# Patient Record
Sex: Male | Born: 1935 | Race: White | Hispanic: No | Smoking: Never smoker
Health system: Southern US, Community
[De-identification: ages and names within clinical notes are randomized; demographics above are authoritative.]

---

## 2021-08-18 ENCOUNTER — Encounter (HOSPITAL_COMMUNITY): Payer: Self-pay

## 2021-08-18 ENCOUNTER — Emergency Department (HOSPITAL_COMMUNITY): Payer: Medicare Other

## 2021-08-18 ENCOUNTER — Other Ambulatory Visit: Payer: Self-pay

## 2021-08-18 ENCOUNTER — Emergency Department (HOSPITAL_COMMUNITY)
Admission: EM | Admit: 2021-08-18 | Discharge: 2021-08-18 | Disposition: A | Discharge status: Home / Self Care | Payer: Medicare Other | Attending: Emergency Medicine | Admitting: Emergency Medicine

## 2021-08-18 DIAGNOSIS — R638 Other symptoms and signs concerning food and fluid intake: Secondary | ICD-10-CM | POA: Diagnosis not present

## 2021-08-18 DIAGNOSIS — E870 Hyperosmolality and hypernatremia: Secondary | ICD-10-CM | POA: Diagnosis not present

## 2021-08-18 DIAGNOSIS — Z79899 Other long term (current) drug therapy: Secondary | ICD-10-CM | POA: Insufficient documentation

## 2021-08-18 DIAGNOSIS — R4781 Slurred speech: Secondary | ICD-10-CM | POA: Diagnosis not present

## 2021-08-18 DIAGNOSIS — R29818 Other symptoms and signs involving the nervous system: Secondary | ICD-10-CM

## 2021-08-18 DIAGNOSIS — R531 Weakness: Secondary | ICD-10-CM | POA: Diagnosis present

## 2021-08-18 DIAGNOSIS — I639 Cerebral infarction, unspecified: Secondary | ICD-10-CM | POA: Diagnosis not present

## 2021-08-18 DIAGNOSIS — I951 Orthostatic hypotension: Secondary | ICD-10-CM | POA: Insufficient documentation

## 2021-08-18 LAB — CBC
HCT: 31.1 % — ABNORMAL LOW (ref 39.0–52.0)
Hemoglobin: 10 g/dL — ABNORMAL LOW (ref 13.0–17.0)
MCH: 29 pg (ref 26.0–34.0)
MCHC: 32.2 g/dL (ref 30.0–36.0)
MCV: 90.1 fL (ref 80.0–100.0)
Platelets: 160 10*3/uL (ref 150–400)
RBC: 3.45 MIL/uL — ABNORMAL LOW (ref 4.22–5.81)
RDW: 13.9 % (ref 11.5–15.5)
WBC: 5.7 10*3/uL (ref 4.0–10.5)
nRBC: 0 % (ref 0.0–0.2)

## 2021-08-18 LAB — I-STAT CHEM 8, ED
BUN: 30 mg/dL — ABNORMAL HIGH (ref 8–23)
Calcium, Ion: 1.15 mmol/L (ref 1.15–1.40)
Chloride: 112 mmol/L — ABNORMAL HIGH (ref 98–111)
Creatinine, Ser: 1.3 mg/dL — ABNORMAL HIGH (ref 0.61–1.24)
Glucose, Bld: 105 mg/dL — ABNORMAL HIGH (ref 70–99)
HCT: 27 % — ABNORMAL LOW (ref 39.0–52.0)
Hemoglobin: 9.2 g/dL — ABNORMAL LOW (ref 13.0–17.0)
Potassium: 4.5 mmol/L (ref 3.5–5.1)
Sodium: 142 mmol/L (ref 135–145)
TCO2: 21 mmol/L — ABNORMAL LOW (ref 22–32)

## 2021-08-18 LAB — COMPREHENSIVE METABOLIC PANEL
ALT: 17 U/L (ref 0–44)
AST: 24 U/L (ref 15–41)
Albumin: 3.4 g/dL — ABNORMAL LOW (ref 3.5–5.0)
Alkaline Phosphatase: 96 U/L (ref 38–126)
Anion gap: 5 (ref 5–15)
BUN: 31 mg/dL — ABNORMAL HIGH (ref 8–23)
CO2: 21 mmol/L — ABNORMAL LOW (ref 22–32)
Calcium: 9.2 mg/dL (ref 8.9–10.3)
Chloride: 115 mmol/L — ABNORMAL HIGH (ref 98–111)
Creatinine, Ser: 1.34 mg/dL — ABNORMAL HIGH (ref 0.61–1.24)
GFR, Estimated: 50 mL/min — ABNORMAL LOW (ref >60)
Glucose, Bld: 106 mg/dL — ABNORMAL HIGH (ref 70–99)
Potassium: 4.6 mmol/L (ref 3.5–5.1)
Sodium: 141 mmol/L (ref 135–145)
Total Bilirubin: 1 mg/dL (ref 0.3–1.2)
Total Protein: 6.6 g/dL (ref 6.5–8.1)

## 2021-08-18 LAB — DIFFERENTIAL
Abs Immature Granulocytes: 0.02 10*3/uL (ref 0.00–0.07)
Basophils Absolute: 0 10*3/uL (ref 0.0–0.1)
Basophils Relative: 1 %
Eosinophils Absolute: 0.2 10*3/uL (ref 0.0–0.5)
Eosinophils Relative: 3 %
Immature Granulocytes: 0 %
Lymphocytes Relative: 15 %
Lymphs Abs: 0.9 10*3/uL (ref 0.7–4.0)
Monocytes Absolute: 0.9 10*3/uL (ref 0.1–1.0)
Monocytes Relative: 16 %
Neutro Abs: 3.7 10*3/uL (ref 1.7–7.7)
Neutrophils Relative %: 65 %

## 2021-08-18 LAB — TROPONIN I (HIGH SENSITIVITY): Troponin I (High Sensitivity): 11 ng/L (ref <18)

## 2021-08-18 LAB — CBG MONITORING, ED: Glucose-Capillary: 98 mg/dL (ref 70–99)

## 2021-08-18 LAB — PROTIME-INR
INR: 1.3 — ABNORMAL HIGH (ref 0.8–1.2)
Prothrombin Time: 15.6 seconds — ABNORMAL HIGH (ref 11.4–15.2)

## 2021-08-18 LAB — APTT: aPTT: 31 seconds (ref 24–36)

## 2021-08-18 IMAGING — MR MR MRA NECK W/O CM
1 series · 18 of 48 positions shown · non-contrast
Comparison: Head CT [DATE]

CLINICAL DATA: Stroke follow-up.



[Series 15: ax (id) · axial · 2.8mm · 0.47mm/px · z∈[-262,+10]mm · 18 of 204 slices shown]
[im 1/204]
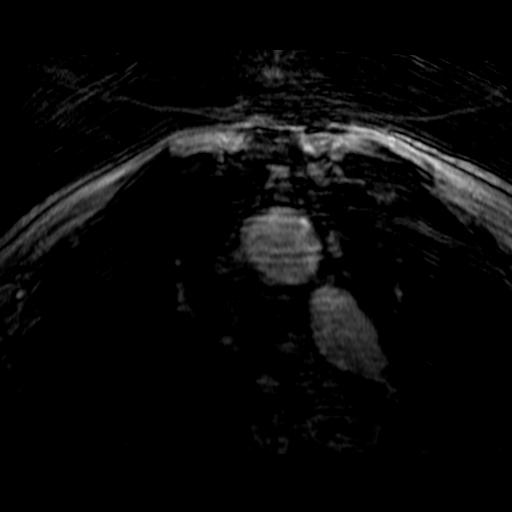
[im 5/204]
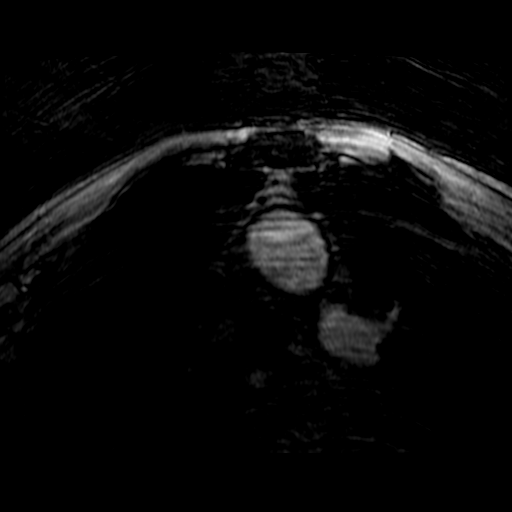
[im 9/204]
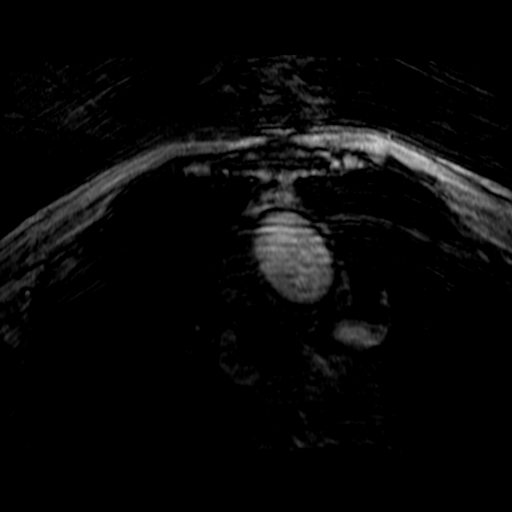
[im 13/204]
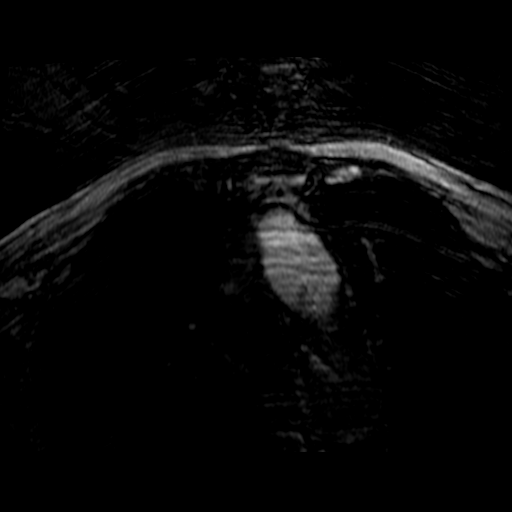
[im 18/204]
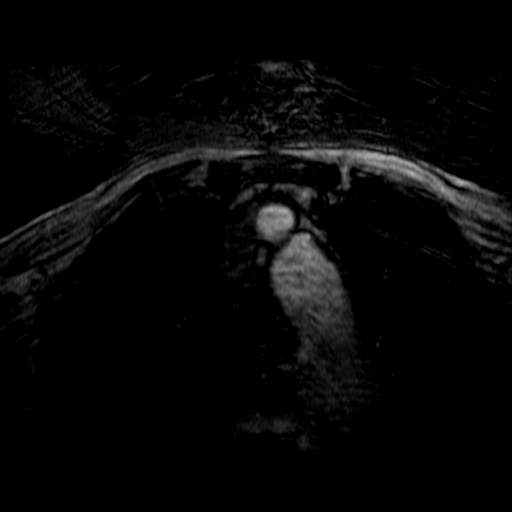
[im 22/204]
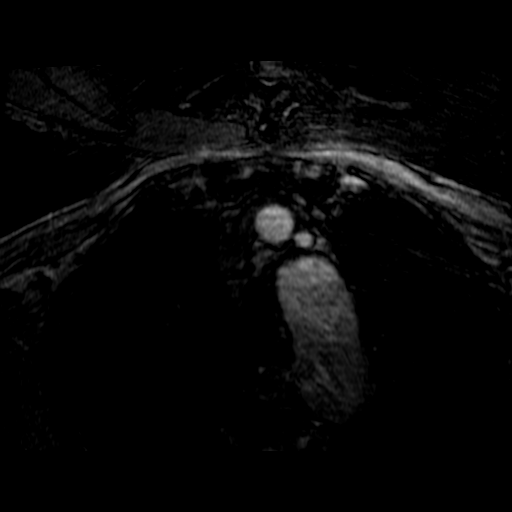
[im 26/204]
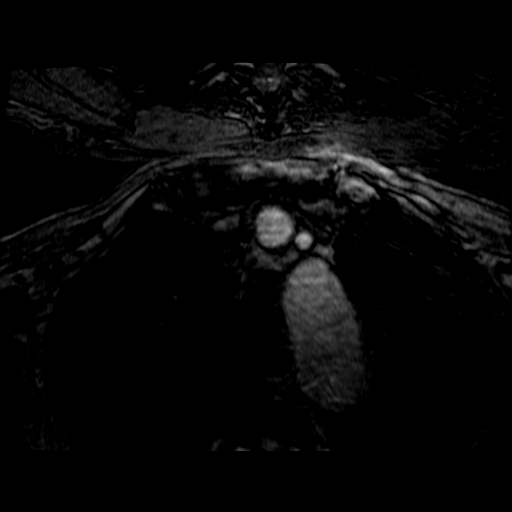
[im 31/204]
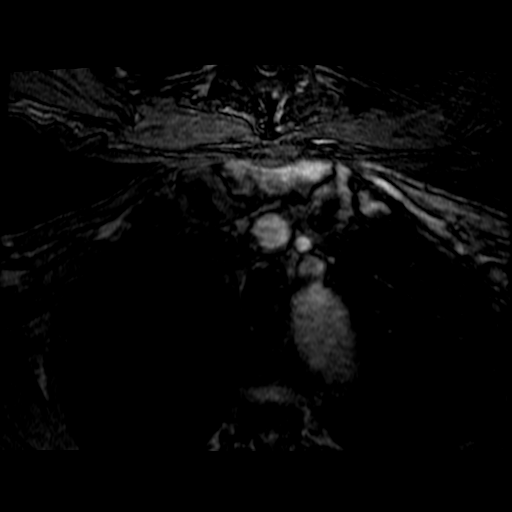
[im 35/204]
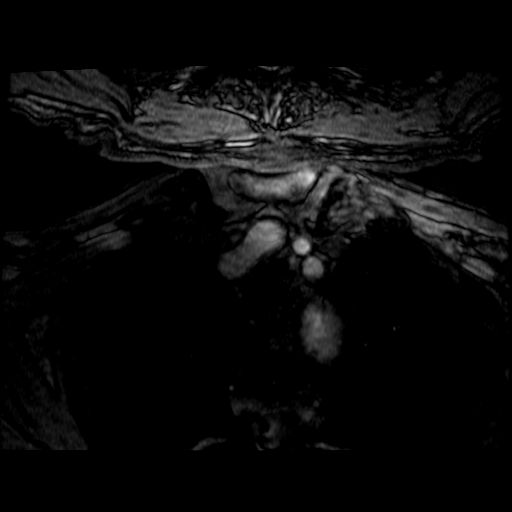
[im 39/204]
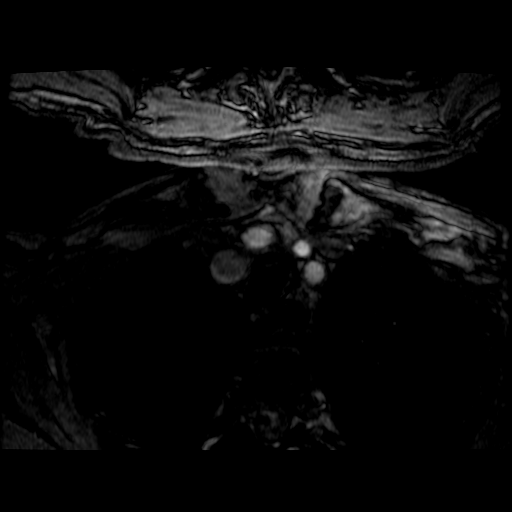
[im 65/204]
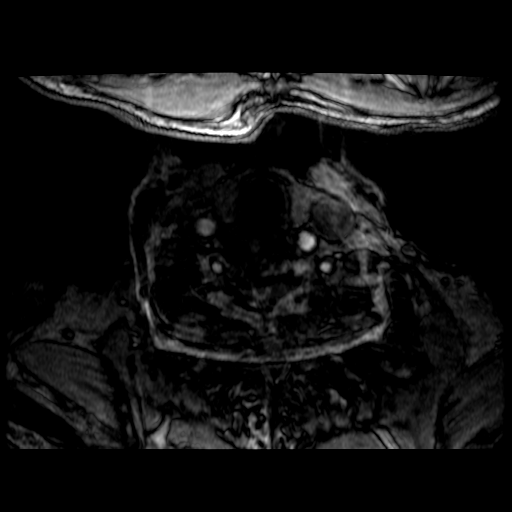
[im 91/204]
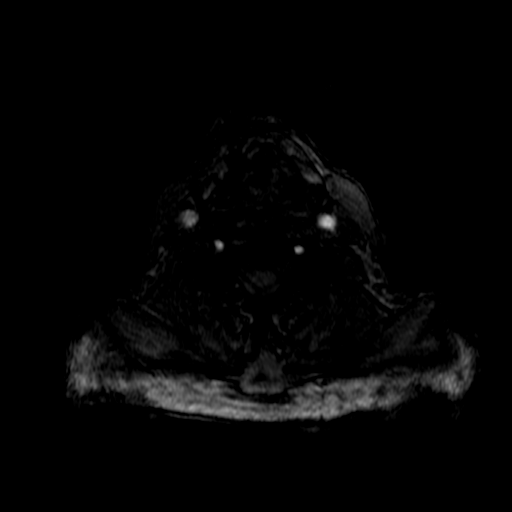
[im 104/204]
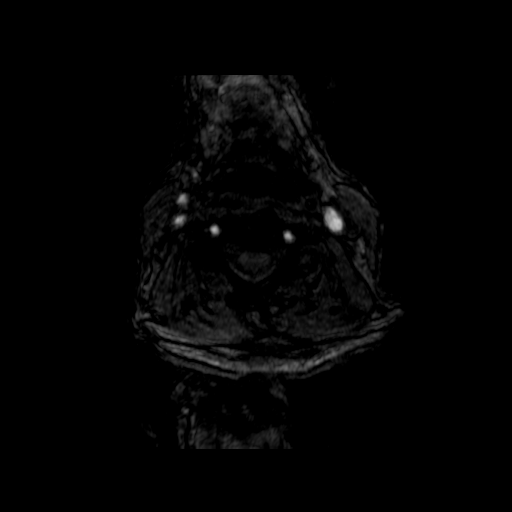
[im 117/204]
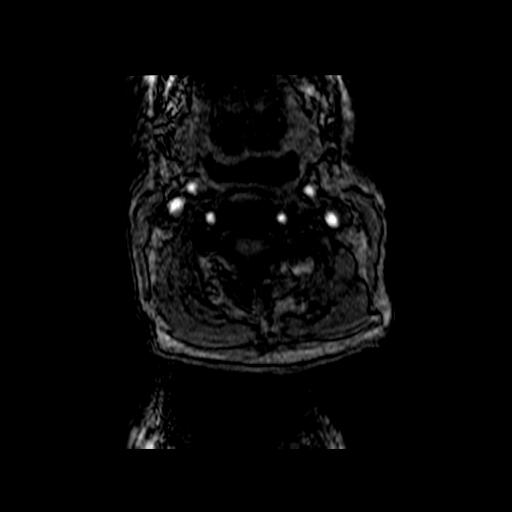
[im 143/204]
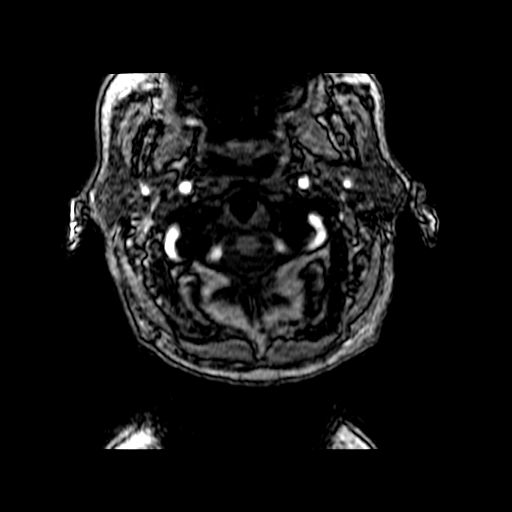
[im 169/204]
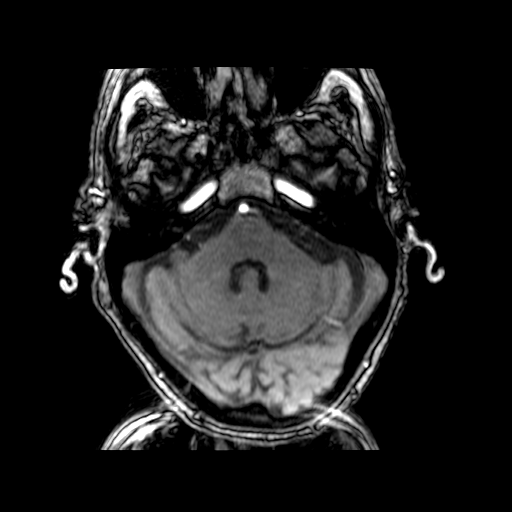
[im 173/204]
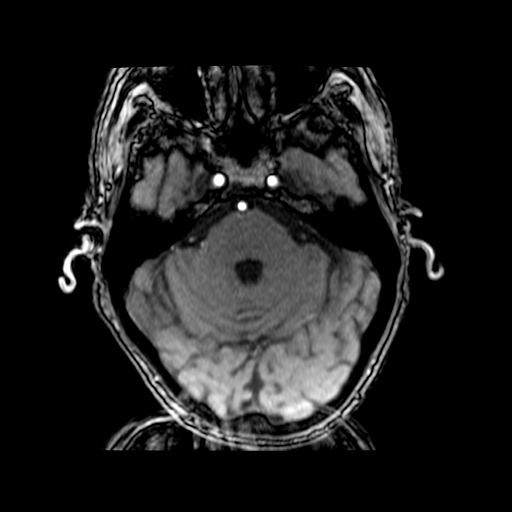
[im 195/204]
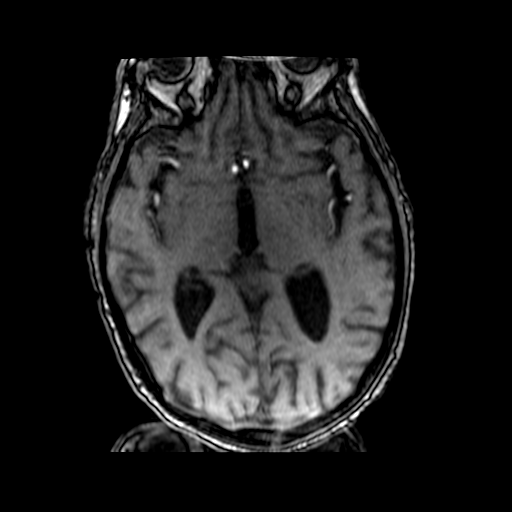

[18 of 48 positions shown; findings below may reference images not displayed]

FINDINGS: MRI HEAD FINDINGS

The study is mildly motion degraded.

Brain: There is no evidence of an acute infarct, intracranial
hemorrhage, mass, midline shift, or extra-axial fluid collection. T2
hyperintensities in the cerebral white matter bilaterally are
nonspecific but compatible with mild chronic small vessel ischemic
disease. A chronic lacunar infarct is noted in the left lentiform
nucleus. There is mild cerebral atrophy.

Vascular: Major intracranial vascular flow voids are preserved.

Skull and upper cervical spine: Unremarkable bone marrow signal.

Sinuses/Orbits: Unremarkable orbits. Clear paranasal sinuses. Trace
right mastoid fluid.

Other: None.

MRA HEAD FINDINGS

The study is mildly to moderately motion degraded.

The intracranial vertebral arteries are widely patent to the
basilar. Patent PICA, AICA, and SCA origins are seen bilaterally.
The basilar artery is widely patent. Posterior communicating
arteries are diminutive or absent. Both PCAs are patent without
evidence of a significant proximal stenosis.

The internal carotid arteries are widely patent from skull base to
carotid termini. ACAs and MCAs are patent without evidence of a
proximal branch occlusion or significant M1 stenosis. There is a
severe right A1 stenosis. No aneurysm is identified.

MRA NECK FINDINGS

Assessment is limited by mild-to-moderate motion artifact and
noncontrast technique.

There is a standard 3 vessel aortic arch without evidence of a
significant arch vessel origin stenosis. The common carotid and
internal carotid arteries are patent without evidence of a
dissection or a significant stenosis on the left. There is 50%
stenosis of the right ICA origin.

The vertebral arteries are patent and codominant with antegrade flow
bilaterally. Assessment for stenosis at the vertebral origins is
limited by the above described technical factors, however there is
no evidence of a significant stenosis or dissection more distally.
IMPRESSION: 1. No acute intracranial abnormality.
2. Mild chronic small vessel ischemic disease.
3. No large vessel occlusion.
4. Severe right A1 stenosis.
5. 50% right ICA origin stenosis.

## 2021-08-18 IMAGING — CT CT HEAD CODE STROKE
3 series · 14 of 47 positions shown, 16 images · non-contrast
Comparison: None Available.

CLINICAL DATA: Code stroke.  Neuro deficit, acute, stroke suspected



[Series 3: head 5.0 st · axial · 0.46mm/px · z∈[+1250,+1390]mm · 8 of 34 slices shown, 10 images]
[im 3/34  brain]
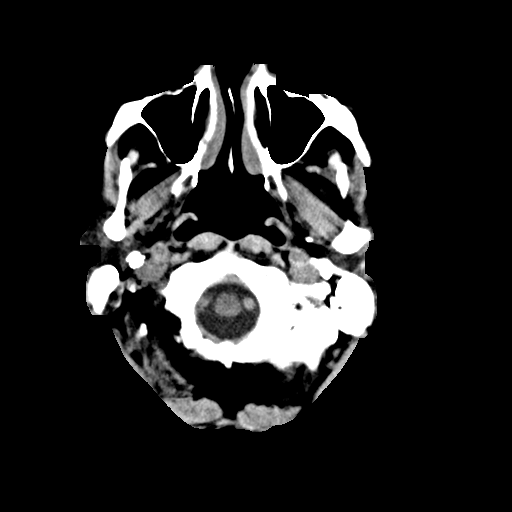
[im 3/34  bone]
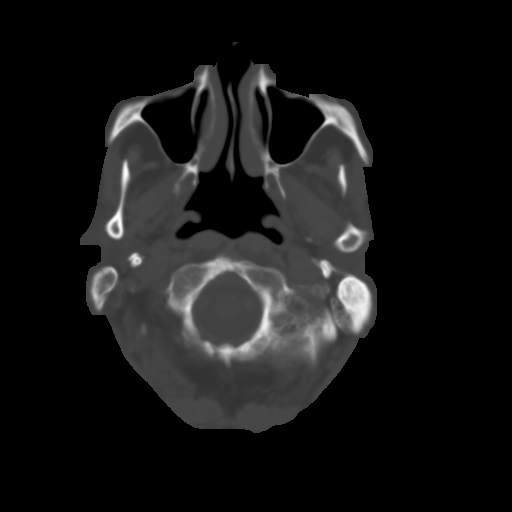
[im 7/34  brain]
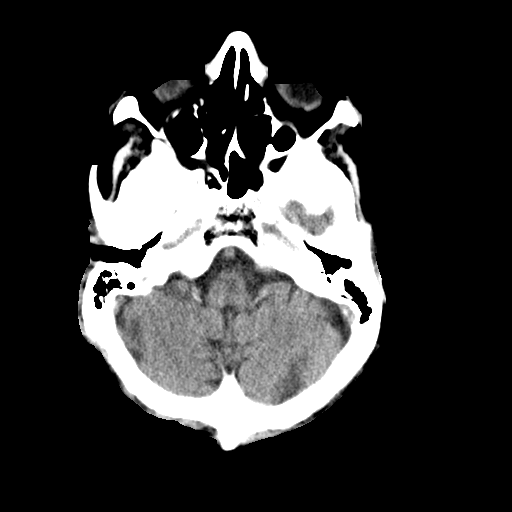
[im 11/34  brain]
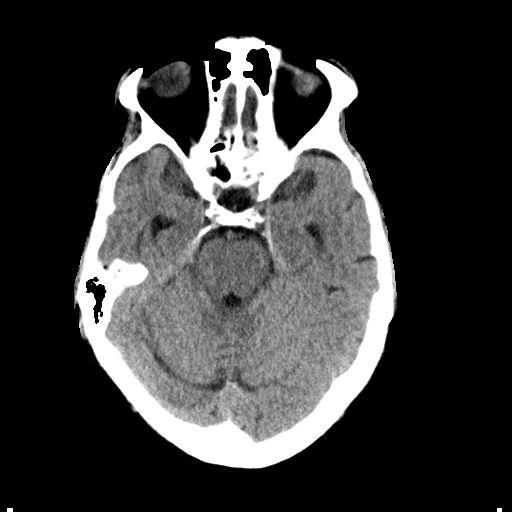
[im 15/34  brain]
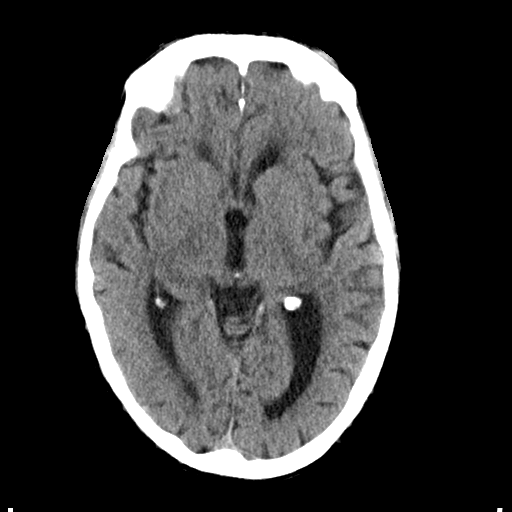
[im 19/34  brain]
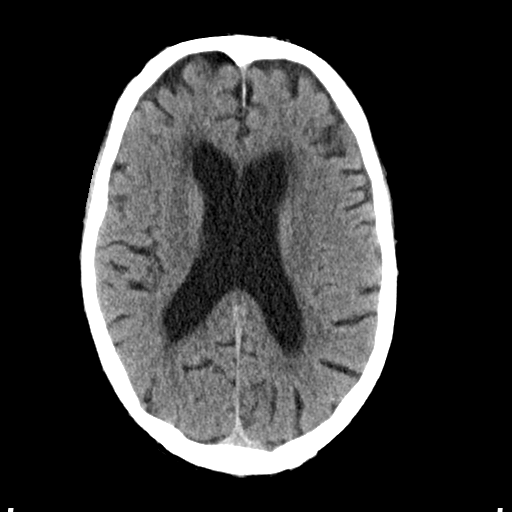
[im 19/34  bone]
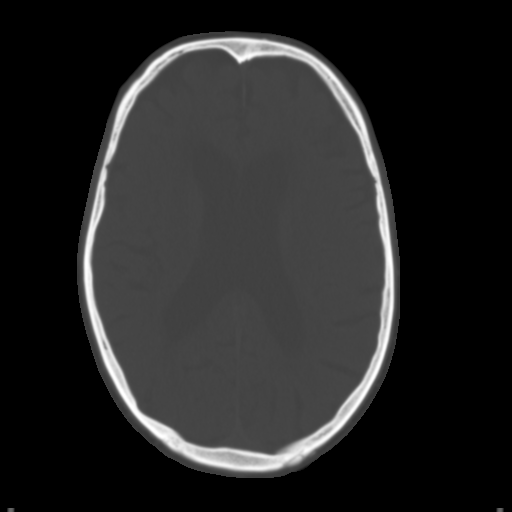
[im 23/34  brain]
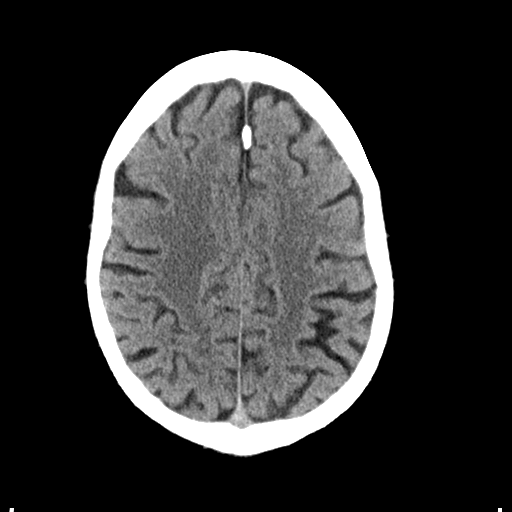
[im 27/34  brain]
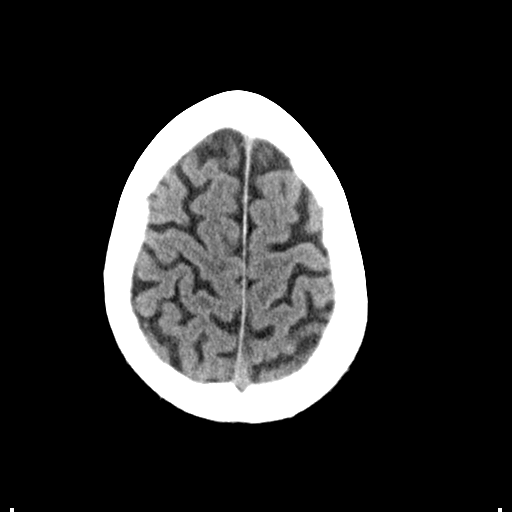
[im 31/34  brain]
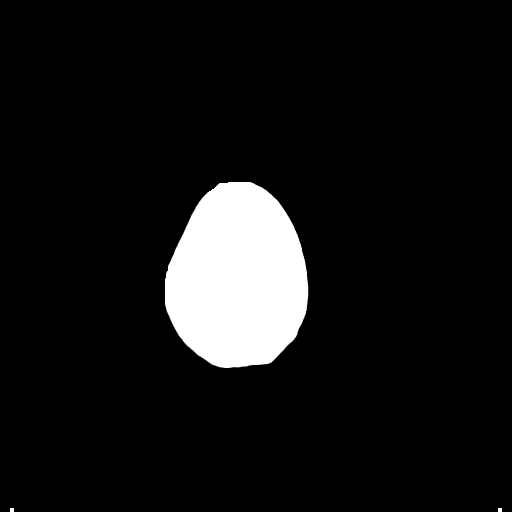

[Series 5: head 3.0 cor st · coronal · 0.32mm/px · 3 of 73 slices shown]
[im 25/73  brain]
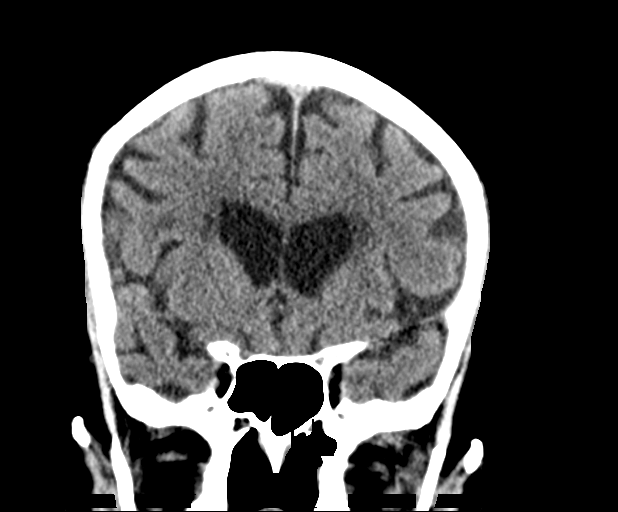
[im 33/73  brain]
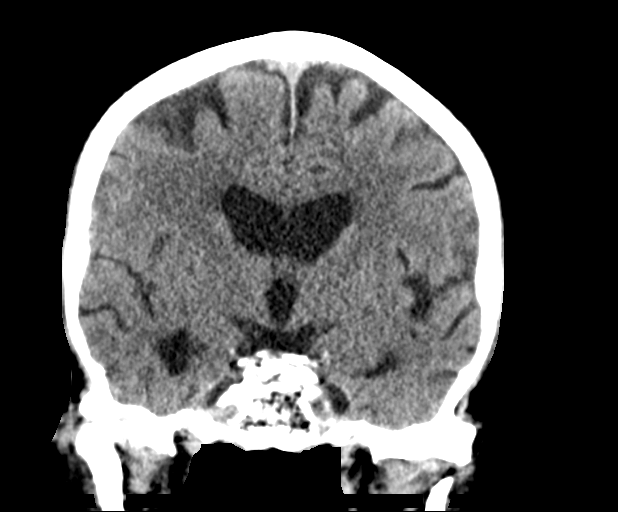
[im 41/73  brain]
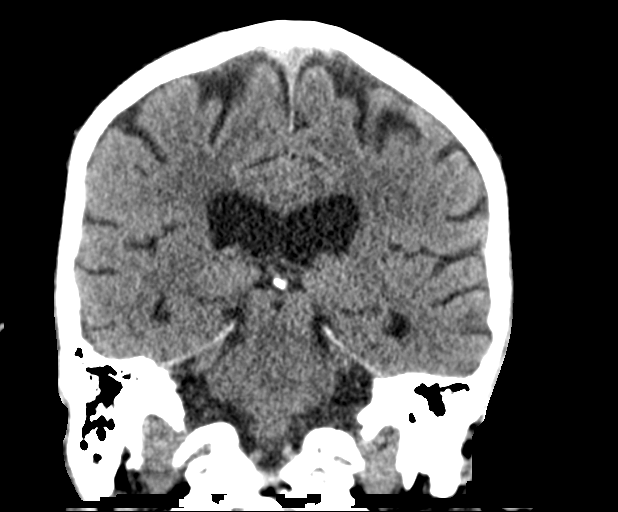

[Series 6: head 3.0 sag st · sagittal · 0.32mm/px · 3 of 58 slices shown]
[im 20/58  brain]
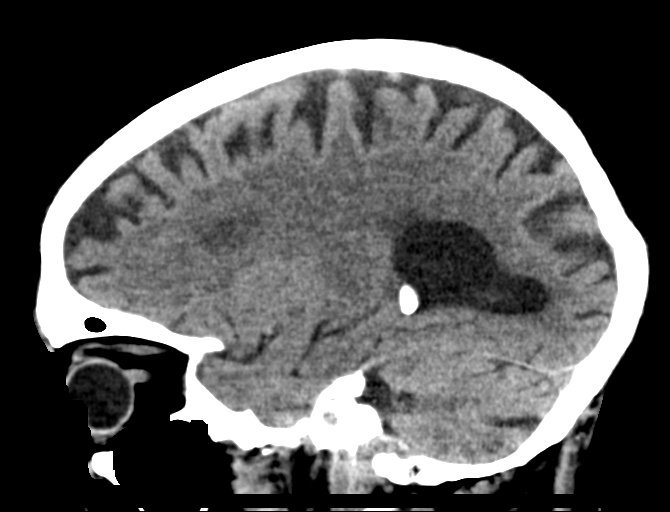
[im 29/58  brain]
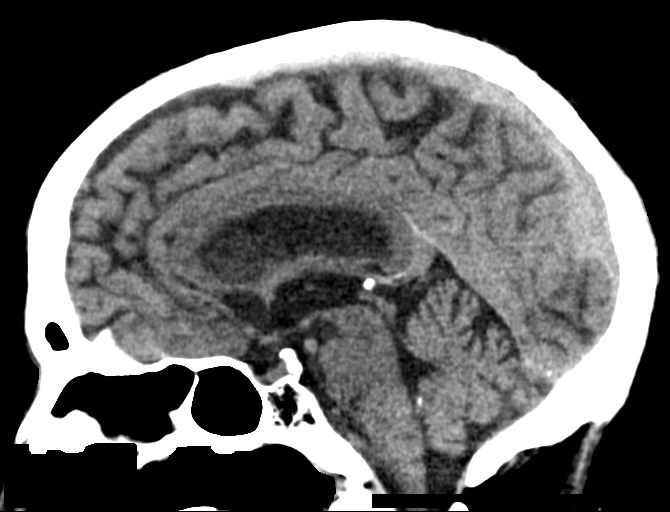
[im 39/58  brain]
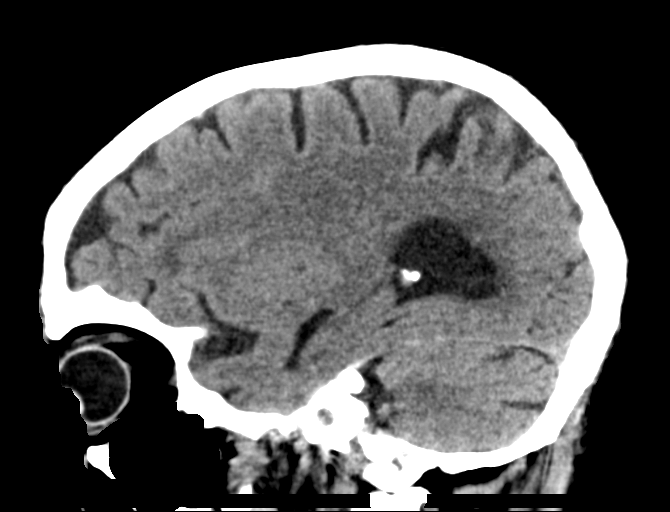

[14 of 47 positions shown; findings below may reference images not displayed]

FINDINGS: Brain: There is no acute intracranial hemorrhage, mass effect, or
edema. Gray-white differentiation is preserved. Prominence of the
ventricles and sulci reflects parenchymal volume loss. Patchy and
confluent areas of hypoattenuation in the supratentorial white
matter are nonspecific but may reflect mild to moderate chronic
microvascular ischemic changes. Age-indeterminate small vessel
infarct of the left corona radiata and lentiform nucleus.

Vascular: No hyperdense vessel. There is intracranial
atherosclerotic calcification at the skull base.

Skull: Unremarkable.

Sinuses/Orbits: No acute abnormality.

Other: Mastoid air cells are clear.

ASPECTS (Alberta Stroke Program Early CT Score)

- Ganglionic level infarction (caudate, lentiform nuclei, internal
capsule, insula, M1-M3 cortex): 7

- Supraganglionic infarction (M4-M6 cortex): 3

Total score (0-10 with 10 being normal): 10
IMPRESSION: There is no acute intracranial hemorrhage or evidence of acute
infarction. ASPECT score is 10.

Mild to moderate chronic microvascular ischemic changes.
Age-indeterminate small vessel infarct of the left corona radiata
and lentiform nucleus.

These results were communicated to Dr. FISCHZUCHT at [DATE] on
[DATE] by text page via the [employer] messaging system.

## 2021-08-18 IMAGING — MR MR MRA HEAD W/O CM
1 series · 16 of 16 positions shown · non-contrast
Comparison: Head CT [DATE]

CLINICAL DATA: Stroke follow-up.



[Series 6: (id) mt fs · axial · 1.4mm · 0.43mm/px · z∈[-60,+61]mm · 16 of 174 slices shown]
[im 1/174]
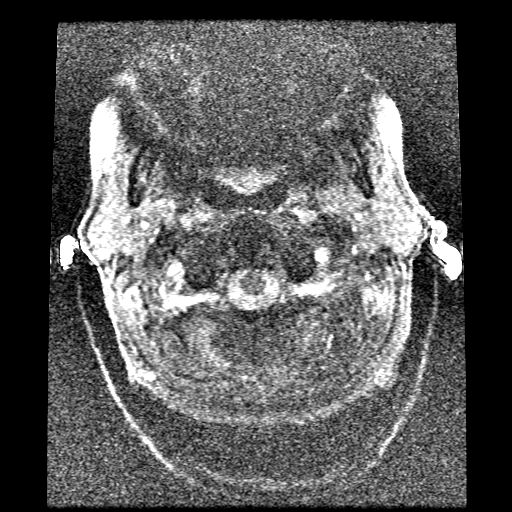
[im 12/174]
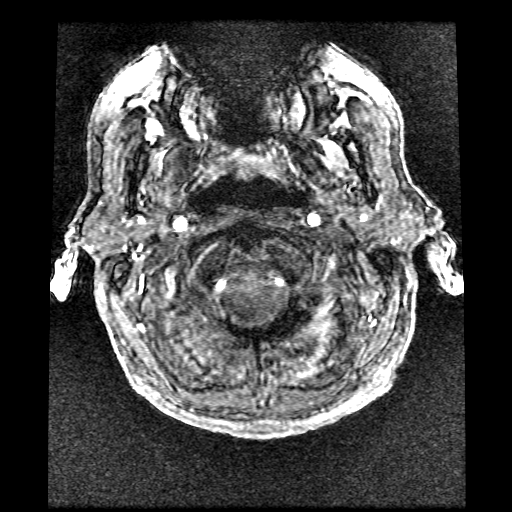
[im 24/174]
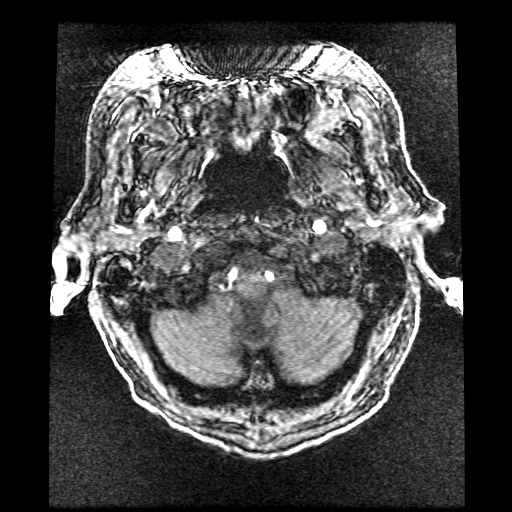
[im 35/174]
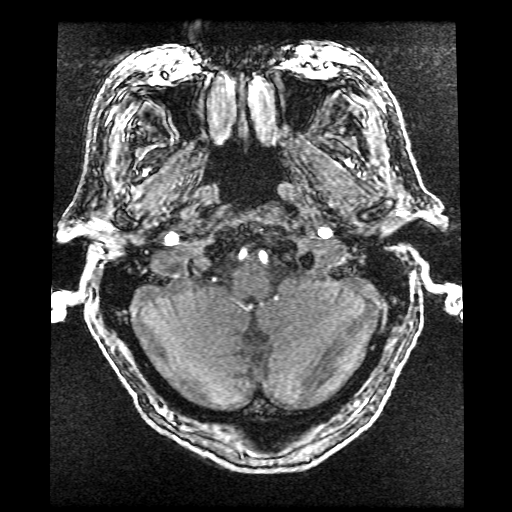
[im 47/174]
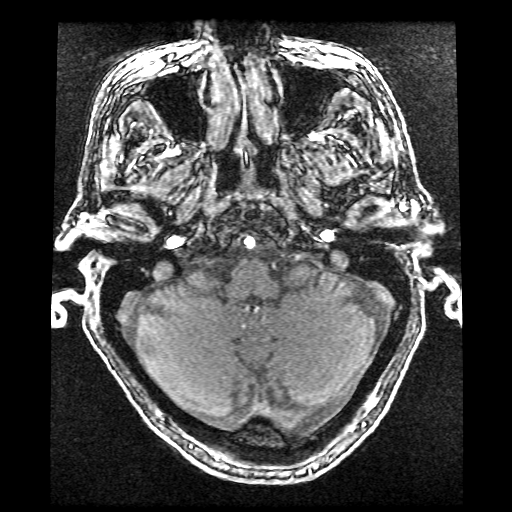
[im 58/174]
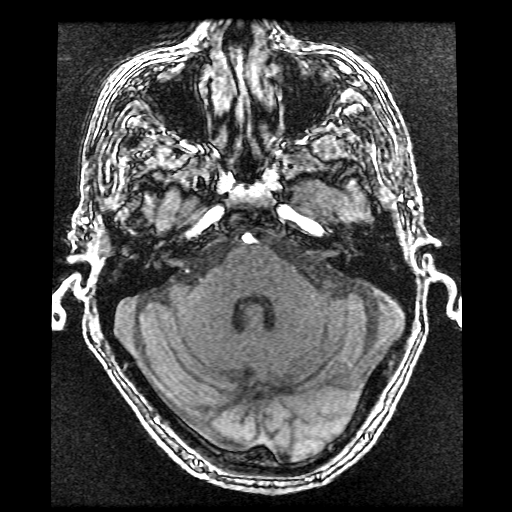
[im 70/174]
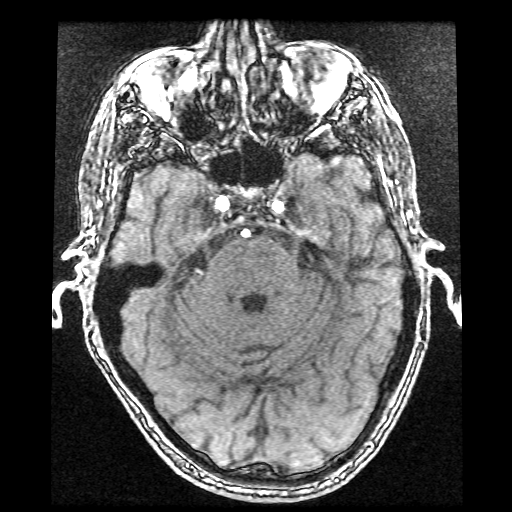
[im 81/174]
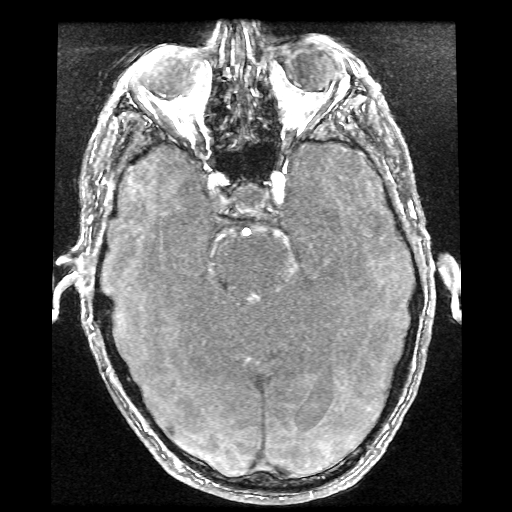
[im 93/174]
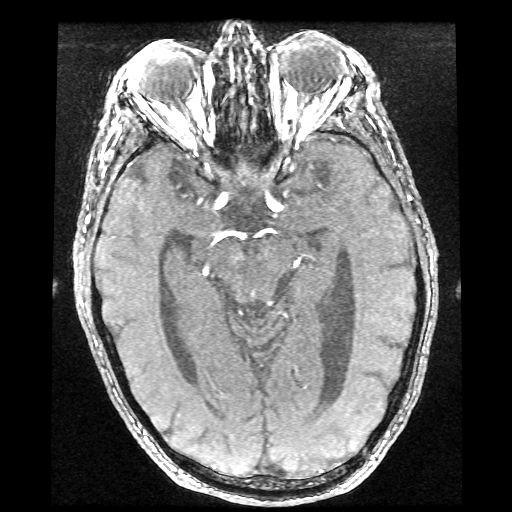
[im 104/174]
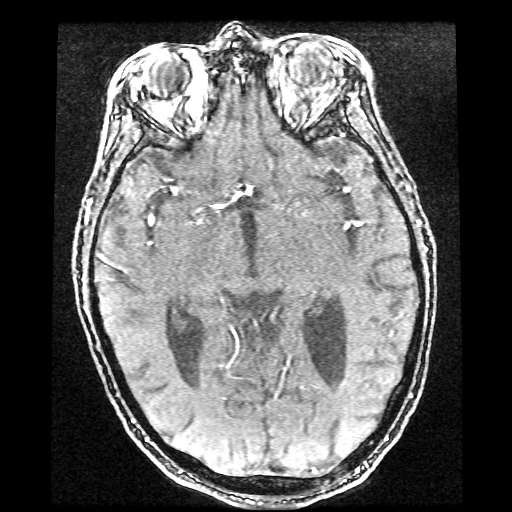
[im 116/174]
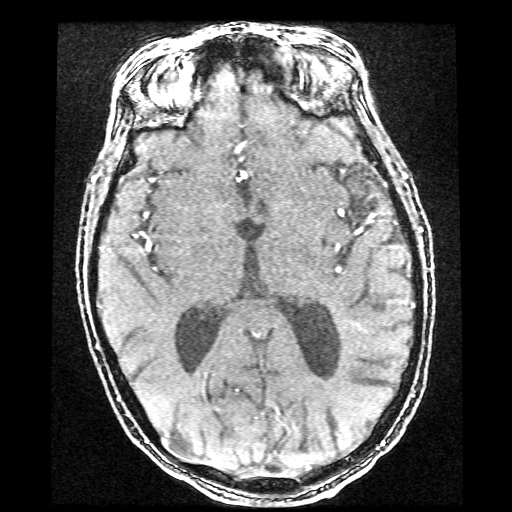
[im 127/174]
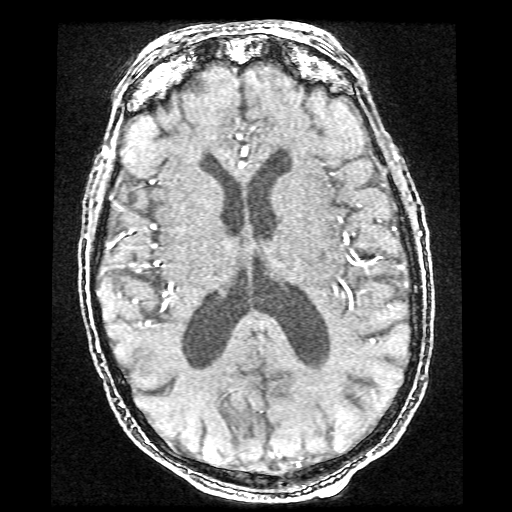
[im 139/174]
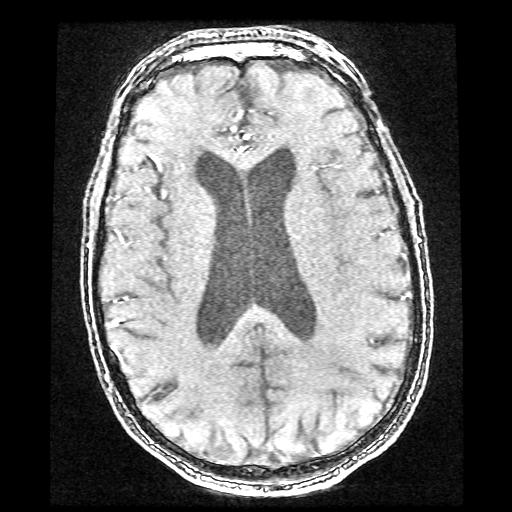
[im 150/174]
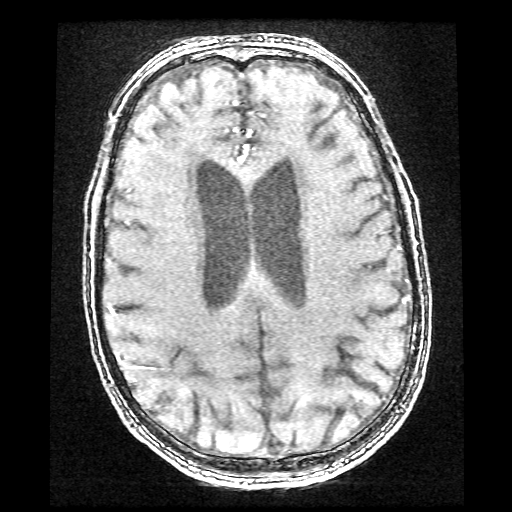
[im 162/174]
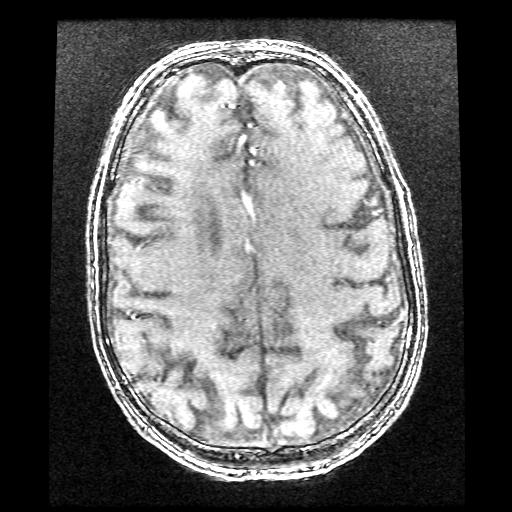
[im 174/174]
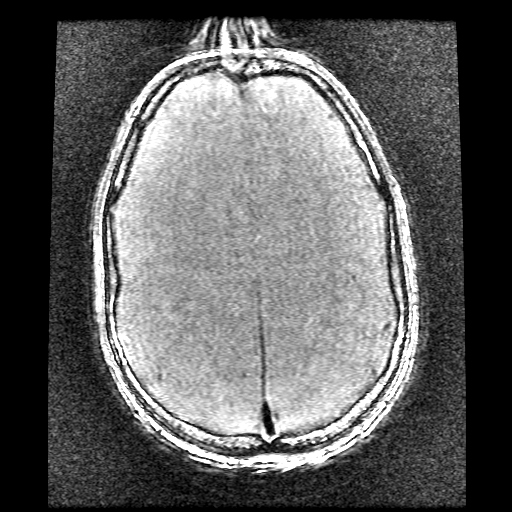

[16 of 16 positions shown; findings below may reference images not displayed]

FINDINGS: MRI HEAD FINDINGS

The study is mildly motion degraded.

Brain: There is no evidence of an acute infarct, intracranial
hemorrhage, mass, midline shift, or extra-axial fluid collection. T2
hyperintensities in the cerebral white matter bilaterally are
nonspecific but compatible with mild chronic small vessel ischemic
disease. A chronic lacunar infarct is noted in the left lentiform
nucleus. There is mild cerebral atrophy.

Vascular: Major intracranial vascular flow voids are preserved.

Skull and upper cervical spine: Unremarkable bone marrow signal.

Sinuses/Orbits: Unremarkable orbits. Clear paranasal sinuses. Trace
right mastoid fluid.

Other: None.

MRA HEAD FINDINGS

The study is mildly to moderately motion degraded.

The intracranial vertebral arteries are widely patent to the
basilar. Patent PICA, AICA, and SCA origins are seen bilaterally.
The basilar artery is widely patent. Posterior communicating
arteries are diminutive or absent. Both PCAs are patent without
evidence of a significant proximal stenosis.

The internal carotid arteries are widely patent from skull base to
carotid termini. ACAs and MCAs are patent without evidence of a
proximal branch occlusion or significant M1 stenosis. There is a
severe right A1 stenosis. No aneurysm is identified.

MRA NECK FINDINGS

Assessment is limited by mild-to-moderate motion artifact and
noncontrast technique.

There is a standard 3 vessel aortic arch without evidence of a
significant arch vessel origin stenosis. The common carotid and
internal carotid arteries are patent without evidence of a
dissection or a significant stenosis on the left. There is 50%
stenosis of the right ICA origin.

The vertebral arteries are patent and codominant with antegrade flow
bilaterally. Assessment for stenosis at the vertebral origins is
limited by the above described technical factors, however there is
no evidence of a significant stenosis or dissection more distally.
IMPRESSION: 1. No acute intracranial abnormality.
2. Mild chronic small vessel ischemic disease.
3. No large vessel occlusion.
4. Severe right A1 stenosis.
5. 50% right ICA origin stenosis.

## 2021-08-18 IMAGING — MR MR HEAD W/O CM
8 of 10 series · 34 of 48 positions shown · non-contrast
Comparison: Head CT [DATE]

CLINICAL DATA: Stroke follow-up.



[Series 3: T1 · sagittal · 5.0mm · 0.47mm/px · 4 of 27 slices shown]
[im 1/27]
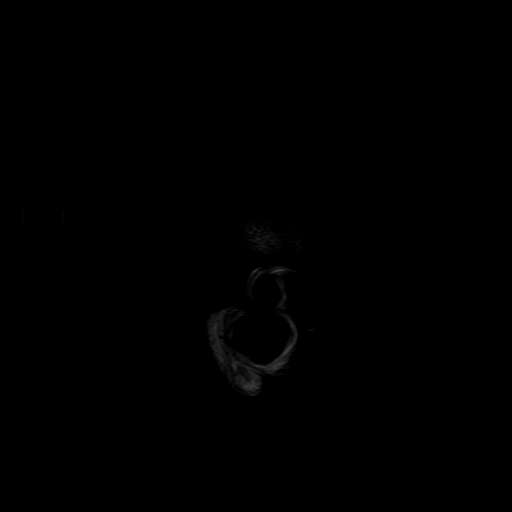
[im 9/27]
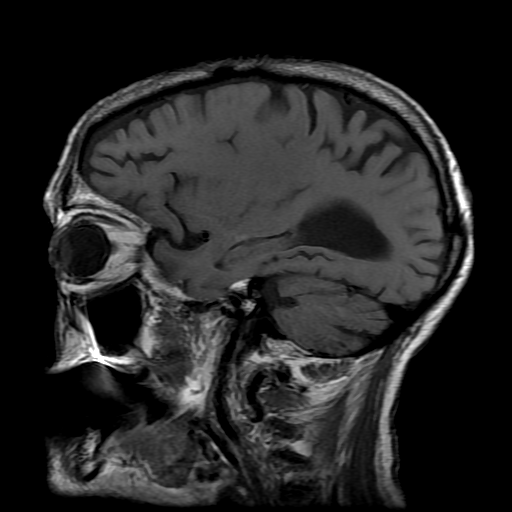
[im 18/27]
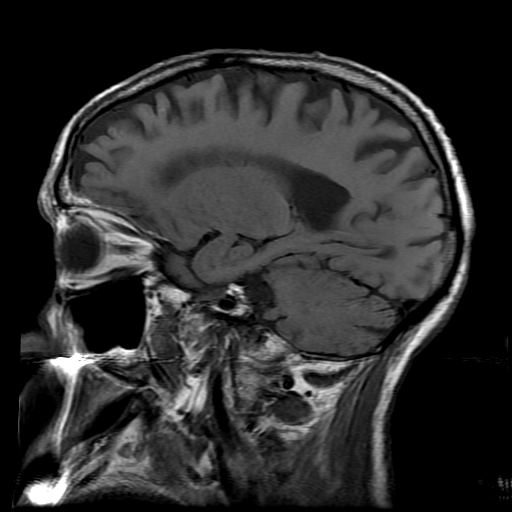
[im 27/27]
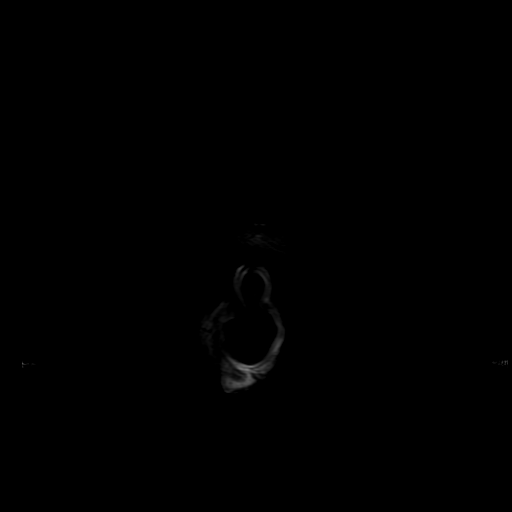

[Series 4: DWI · coronal · 5.0mm · 1.09mm/px · 7 of 70 slices shown (1 of 4)]
[im 1/70]
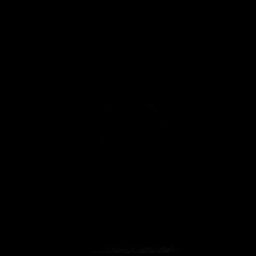
[im 12/70]
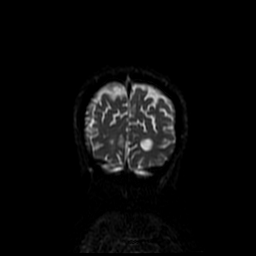
[im 24/70]
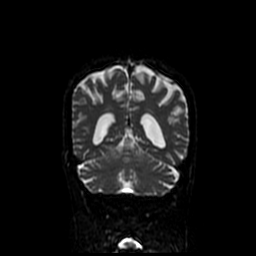
[im 35/70]
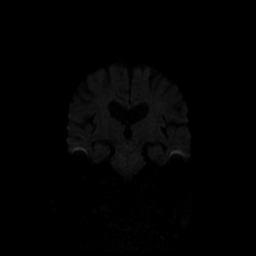
[im 47/70]
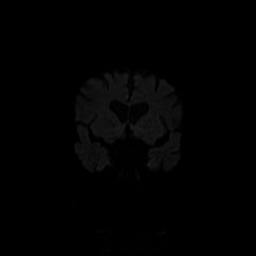
[im 58/70]
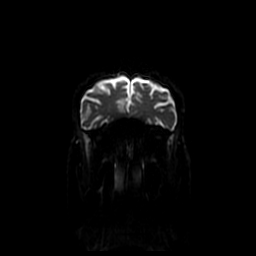
[im 70/70]
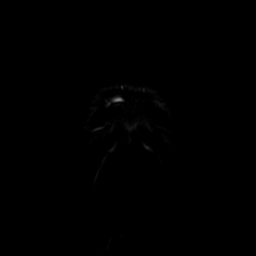

[Series 5: DWI · axial · 3.0mm · 1.09mm/px · z∈[-30,+128]mm · 8 of 110 slices shown (2 of 4)]
[im 1/110]
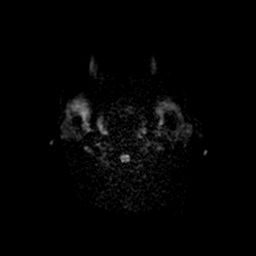
[im 13/110]
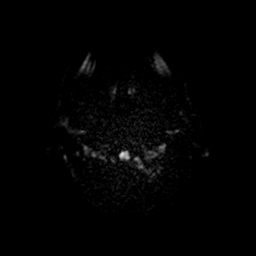
[im 37/110]
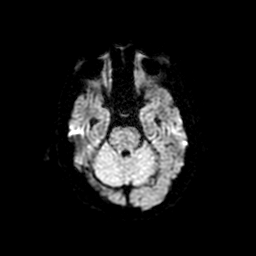
[im 49/110]
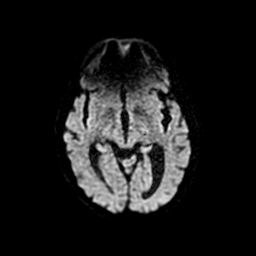
[im 61/110]
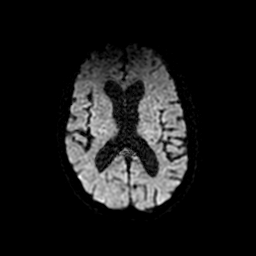
[im 73/110]
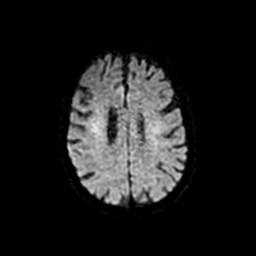
[im 97/110]
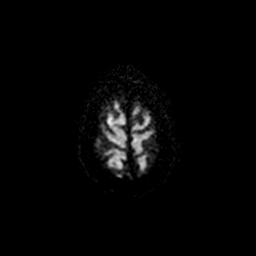
[im 110/110]
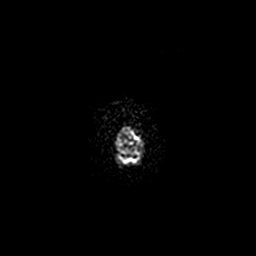

[Series 7: T2 · axial · 5.0mm · 0.47mm/px · z∈[-26,+121]mm · 2 of 26 slices shown (1 of 2)]
[im 1/26]
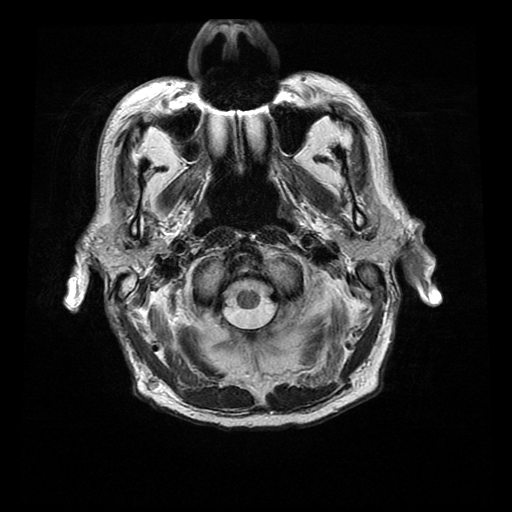
[im 26/26]
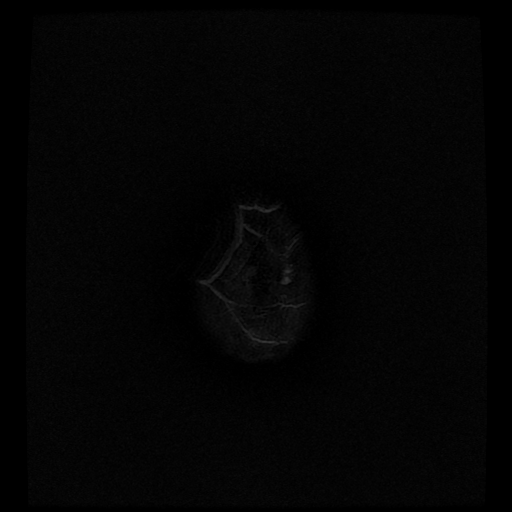

[Series 8: FLAIR · axial · 3.0mm · 0.47mm/px · z∈[-26,+121]mm · 2 of 26 slices shown]
[im 1/26]
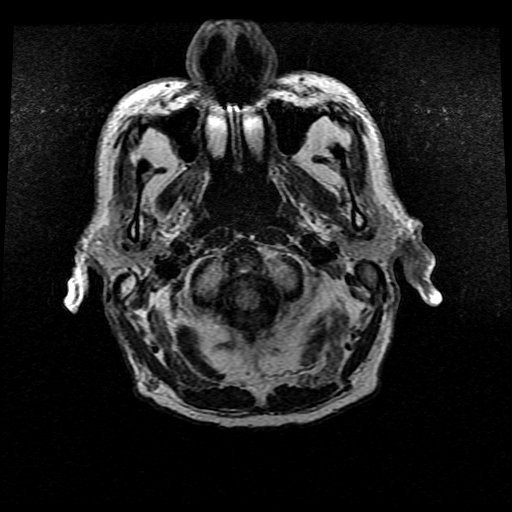
[im 26/26]
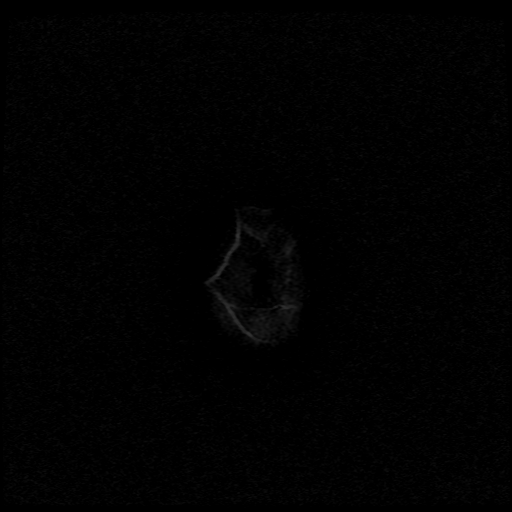

[Series 11: T2 · coronal · 5.0mm · 0.43mm/px · 3 of 30 slices shown (2 of 2)]
[im 1/30]
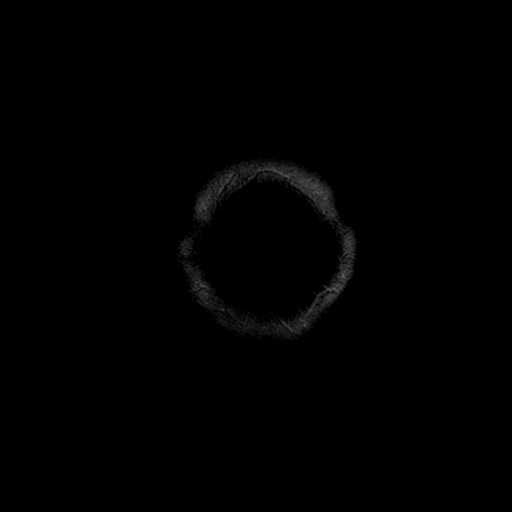
[im 15/30]
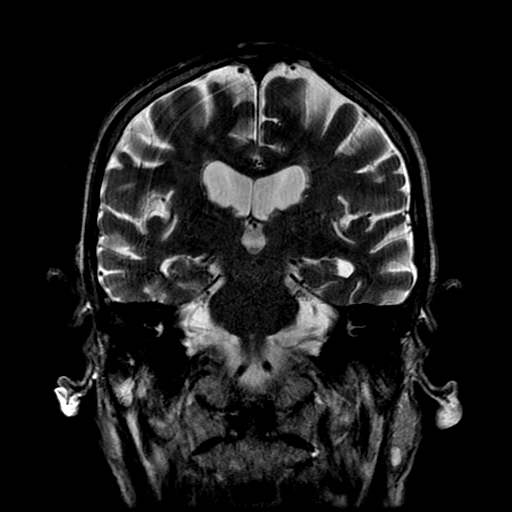
[im 30/30]
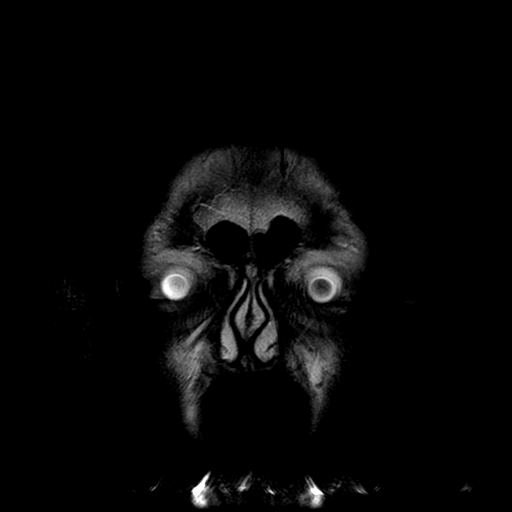

[Series 400: DWI · coronal · 5.0mm · 1.09mm/px · 3 of 35 slices shown (3 of 4)]
[im 1/35]
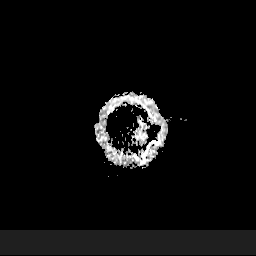
[im 18/35]
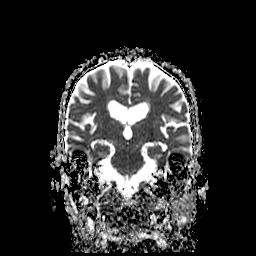
[im 35/35]
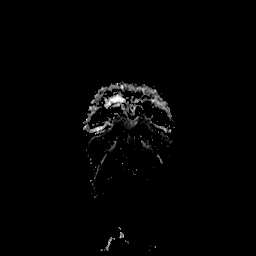

[Series 500: DWI · axial · 3.0mm · 1.09mm/px · z∈[-30,+128]mm · 5 of 55 slices shown (4 of 4)]
[im 1/55]
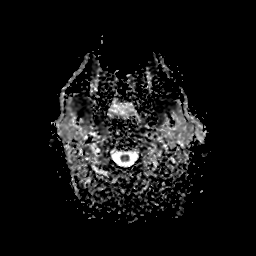
[im 14/55]
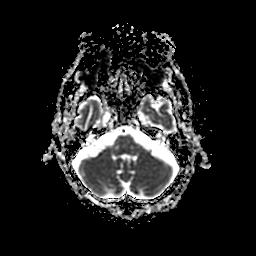
[im 28/55]
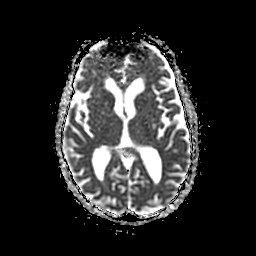
[im 41/55]
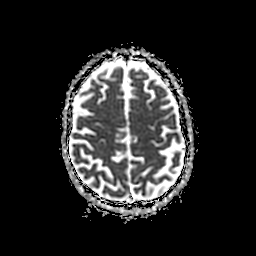
[im 55/55]
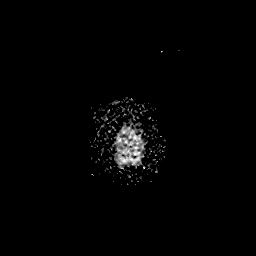

[34 of 48 positions shown; findings below may reference images not displayed]

FINDINGS: MRI HEAD FINDINGS

The study is mildly motion degraded.

Brain: There is no evidence of an acute infarct, intracranial
hemorrhage, mass, midline shift, or extra-axial fluid collection. T2
hyperintensities in the cerebral white matter bilaterally are
nonspecific but compatible with mild chronic small vessel ischemic
disease. A chronic lacunar infarct is noted in the left lentiform
nucleus. There is mild cerebral atrophy.

Vascular: Major intracranial vascular flow voids are preserved.

Skull and upper cervical spine: Unremarkable bone marrow signal.

Sinuses/Orbits: Unremarkable orbits. Clear paranasal sinuses. Trace
right mastoid fluid.

Other: None.

MRA HEAD FINDINGS

The study is mildly to moderately motion degraded.

The intracranial vertebral arteries are widely patent to the
basilar. Patent PICA, AICA, and SCA origins are seen bilaterally.
The basilar artery is widely patent. Posterior communicating
arteries are diminutive or absent. Both PCAs are patent without
evidence of a significant proximal stenosis.

The internal carotid arteries are widely patent from skull base to
carotid termini. ACAs and MCAs are patent without evidence of a
proximal branch occlusion or significant M1 stenosis. There is a
severe right A1 stenosis. No aneurysm is identified.

MRA NECK FINDINGS

Assessment is limited by mild-to-moderate motion artifact and
noncontrast technique.

There is a standard 3 vessel aortic arch without evidence of a
significant arch vessel origin stenosis. The common carotid and
internal carotid arteries are patent without evidence of a
dissection or a significant stenosis on the left. There is 50%
stenosis of the right ICA origin.

The vertebral arteries are patent and codominant with antegrade flow
bilaterally. Assessment for stenosis at the vertebral origins is
limited by the above described technical factors, however there is
no evidence of a significant stenosis or dissection more distally.
IMPRESSION: 1. No acute intracranial abnormality.
2. Mild chronic small vessel ischemic disease.
3. No large vessel occlusion.
4. Severe right A1 stenosis.
5. 50% right ICA origin stenosis.

## 2021-08-18 MED ORDER — SODIUM CHLORIDE 0.9 % IV SOLN: INTRAVENOUS | Status: AC

## 2021-08-18 MED ORDER — SODIUM CHLORIDE 0.9% FLUSH
3.0000 mL | Freq: Once | INTRAVENOUS | Status: AC
  Administered 2021-08-18: 3 mL via INTRAVENOUS

## 2021-08-18 NOTE — Code Documentation (Addendum)
Stroke Response Nurse Documentation Code Documentation  Dezmin Seitter is a 88 y.o. male arriving to [facility]  via [facility] on 08/18/21 with past medical hx of HTN, stent placement a few years ago. On asa 81 daily and plavix daily. Code stroke was activated by EMS.   Patient from grandsons house where he is visiting from [place].  He was LKW 0900 when he went on the porch to drink a cup of coffee.  His grandson heard the cup shatter and found him difficult to arouse and slumped over. Upon EMS arrival, patient noted to have left facial droop and weakness and to be hypotensive. Symptoms improved with positioning. Grandson notes that patient did not take his medications yesterday 5/21 and today 5/22.   Stroke team at the bedside on patient arrival. Labs drawn and patient cleared for CT by Dr. Alvino Chapel. Patient to CT with team. NIHSS 1, see documentation for details and code stroke times. Patient with slight left facial droop on exam.   The following imaging was completed:  CT Head. Patient is not a candidate for IV Thrombolytic due to too mild to treat. Patient is not a candidate for IR due to not LVO positive.   Care Plan: q30 VS and mNIHSS until out of TNK window at 1330.  If symptoms return or worsen, reactivate code stroke.    MRI/MRA and EEG ordered.  Bedside handoff with ED RN Jinny Blossom.    Candace Cruise K  Stroke Response RN

## 2021-08-18 NOTE — Progress Notes (Signed)
EEG complete - results pending 

## 2021-08-18 NOTE — Consult Note (Signed)
Neurology Consultation  Reason for Consult: Code Stroke  Referring Physician: Maryan Rued  CC: left side weakness  History is obtained from: Patient  HPI: Miguel Steele is a 88 y.o. male with a previous stent placement on aspirin and plavix, metoprolol for HTN Had a couple of drinks last night. Went outside this morning to drink a cup of coffee and his grandson heard the cup fall and went outside and saw him slumped, unresponsive and difficult to arouse, with left facial droop and left side weakness. When EMS arrive he was still difficult to arouse, woke up on the way to the hospital. On arrival he has no neurological deficits. He does not remember the incident. He remembers going outside with his coffee and then being in the ambulance itself. Initial BP by EMS 78/32.  Originally from [place]. Traveling to visit family from [place].  LKW: 0900 TNK given?: no, NIH 1 IR Thrombectomy? No, no LVO Modified Rankin Scale: 0-Completely asymptomatic and back to baseline post- stroke  ROS: A complete ROS was performed and is negative except as noted in the HPI.  No past medical history on file.   No family history on file.   Social History:   has no history on file for tobacco use, alcohol use, and drug use.  Medications  Current Facility-Administered Medications:    0.9 %  sodium chloride infusion, , Intravenous, Continuous, Annalyn Blecher, NP   sodium chloride flush (NS) 0.9 % injection 3 mL, 3 mL, Intravenous, Once, Blanchie Dessert, MD No current outpatient medications on file.   Exam: Current vital signs: There were no vitals taken for this visit. Vital signs in last 24 hours:    GENERAL: Awake, alert, in no acute distress Psych: Affect appropriate for situation, patient is calm and cooperative with examination Head: Normocephalic and atraumatic, without obvious abnormality EENT: Normal conjunctivae, dry mucous membranes, no OP obstruction LUNGS: Normal respiratory effort.  Non-labored breathing on room air CV: Regular rate and rhythm on telemetry ABDOMEN: Soft, non-tender, non-distended Extremities: warm, well perfused, without obvious deformity  NEURO:  Mental Status: Awake, alert, and oriented to person, place, time, and situation. He/She is able to provide a clear and coherent history of present illness. Speech/Language: speech is clear.   Naming, repetition, fluency, and comprehension intact without aphasia  No neglect is noted Cranial Nerves:  II: PERRL 61mm/brisk. visual fields full. Slight saccadic dysmetria  III, IV, VI: EOMI. Lid elevation symmetric and full.  V: Sensation is intact to light touch and symmetrical to face. Blinks to threat. Moves jaw back and forth.  VII: Face is symmetric smiling. Able to puff cheeks and raise eyebrows. Questionable facial asymmetry at rest VIII: Hearing intact to voice IX, X: Palate elevation is symmetric. Phonation normal.  XI: Normal sternocleidomastoid and trapezius muscle strength XII: Tongue protrudes midline without fasciculations.   Motor: 5/5 strength is all muscle groups.  Tone is normal. Bulk is normal.  Sensation: Intact to light touch bilaterally in all four extremities. No extinction to DSS present.  Coordination: FTN intact bilaterally. HKS intact bilaterally. No pronator drift. Alternating hand movements.  DTRs: 2+ throughout.  Gait: Deferred  NIHSS: 1a Level of Conscious.: 0 1b LOC Questions: 0 1c LOC Commands: 0 2 Best Gaze: 0 3 Visual: 0 4 Facial Palsy: 1 5a Motor Arm - left: 0 5b Motor Arm - Right: 0 6a Motor Leg - Left: 0 6b Motor Leg - Right: 0 7 Limb Ataxia: 0 8 Sensory: 0 9 Best Language: 0 10  Dysarthria: 0 11 Extinct. and Inatten.: 0 TOTAL: 1   Labs I have reviewed labs in epic and the results pertinent to this consultation are:  CBC    Component Value Date/Time   WBC 5.7 08/18/2021 1007   RBC 3.45 (L) 08/18/2021 1007   HGB 9.2 (L) 08/18/2021 1011   HCT 27.0 (L)  08/18/2021 1011   PLT 160 08/18/2021 1007   MCV 90.1 08/18/2021 1007   MCH 29.0 08/18/2021 1007   MCHC 32.2 08/18/2021 1007   RDW 13.9 08/18/2021 1007   LYMPHSABS 0.9 08/18/2021 1007   MONOABS 0.9 08/18/2021 1007   EOSABS 0.2 08/18/2021 1007   BASOSABS 0.0 08/18/2021 1007    CMP     Component Value Date/Time   NA 142 08/18/2021 1011   K 4.5 08/18/2021 1011   CL 112 (H) 08/18/2021 1011   GLUCOSE 105 (H) 08/18/2021 1011   BUN 30 (H) 08/18/2021 1011   CREATININE 1.30 (H) 08/18/2021 1011    Lipid Panel  No results found for: CHOL, TRIG, HDL, CHOLHDL, VLDL, LDLCALC, LDLDIRECT   Imaging I have reviewed the images obtained:  CT-scan of the brain- mild to moderate chronic microvascular ischemic changes. Age- indeterminate small vessel infarct of the left corona radiata and lentiform nucleus.   MRI examination of the brain- No acute intracranial abnormality  MRA head and neck- Severe right A1 stenosis. 50% right ICA origin stenosis.  Assessment: syncopal episode due to heat, dehydration   Impression: 88 year old male with past medical history of CAD with stents and HTN presenting with a possible syncopal episode related to hypotension, dehydration, and heat exposure   Recommendations: - MRI brain - MRA head/neck  - Resume home medications- ASA 81mg , Plavix 75mg  - Orthostatic vitals  -  Patient seen and examined by NP/APP with MD. MD to update note as needed.   Janine Ores, DNP, FNP-BC [facility] Pager: [phone]

## 2021-08-18 NOTE — ED Triage Notes (Signed)
Patient was sitting on the porch drinking cup of coffee and grandson heard coffee cup drop and reported to EMS he had a hard time waking him up. EMS called patient in as code stroke. Last known well was 0900. Patient currently with no deficits. Patient alert and oriented x4.

## 2021-08-18 NOTE — ED Notes (Signed)
DC instructions reviewed with pt. Pt verbalized understanding.  Pt DC 

## 2021-08-18 NOTE — Procedures (Addendum)
Patient Name: Miguel Steele  MRN: [id number]  Epilepsy Attending: Charlsie Quest  Referring Physician/Provider: Elmer Picker, NP Date: 08/18/2021 Duration: 21.59 mins   Patient history:  88 year old male who is visiting from [place] presenting today from his grandson's house with facial droop, slurred speech and weakness with hypotension. EEG to evaluate for seizure.  Level of alertness: Awake  AEDs during EEG study: None  Technical aspects: This EEG study was done with scalp electrodes positioned according to the 10-20 International system of electrode placement. Electrical activity was acquired at a sampling rate of 500Hz  and reviewed with a high frequency filter of 70Hz  and a low frequency filter of 1Hz . EEG data were recorded continuously and digitally stored.   Description: The posterior dominant rhythm consists of 8-9 Hz activity of moderate voltage (25-35 uV) seen predominantly in posterior head regions, symmetric and reactive to eye opening and eye closing. Hyperventilation and photic stimulation were not performed.     Of note, parts of study were difficult to interpret due to significant myogenic artifact.  IMPRESSION: This technically difficult study is within normal limits. No seizures or epileptiform discharges were seen throughout the recording.  [name] 

## 2021-08-18 NOTE — Discharge Instructions (Addendum)
Follow-up closely with your regular care providers at home in [place].  Please return for any problem.  Please continue as previously prescribed aspirin and Plavix.

## 2021-08-18 NOTE — ED Provider Notes (Signed)
[facility] EMERGENCY DEPARTMENT Provider Note   CSN: [id number] Arrival date & time: 08/18/21  1002  An emergency department physician performed an initial assessment on this suspected stroke patient at 1003.  History  Chief Complaint  Patient presents with   Code Stroke    Miguel Steele is a 88 y.o. male.  Patient is a 88 year old male who is visiting from [place] presenting today from his grandson's house with facial droop, slurred speech and weakness with hypotension.  Patient arrived yesterday and it was reported he was his normal self.  He got up around 9:00 this morning and he was normal and suddenly his grandson heard the cup of coffee drop and break in the kitchen and he went in and patient was slumped over, mumbling and had a bit of a facial droop.  He called 911.  When they arrived they noted similar symptoms.  Patient was not able to stand and walk due to generalized weakness had a mild facial droop and slight slurred speech.  He was noted to have a blood pressure of 70/40.  In route patient's symptoms started improving and recurrent blood pressure was 80/50.  Patient is on metoprolol but had not taken a dose yesterday or today.  Patient is currently awake and reports he has no chest pain or shortness of breath.  He denies any palpitations or feeling like he is going to pass out.  Denies history of stroke and reports right now he feels pretty good.  He has been eating and drinking normally.  He is able to ambulate without assistance but reports he does not walk as well as he used to.  He reports he just felt very warm out on the porch.  The history is provided by the patient, the EMS personnel and a relative.      Home Medications Prior to Admission medications   Not on File      Allergies    Patient has no known allergies.    Review of Systems   Review of Systems  Physical Exam Updated Vital Signs BP (!) 185/62   Pulse 66   Temp 98.1 F (36.7  C) (Oral)   Resp 13   Wt 64.4 kg   SpO2 98%  Physical Exam Vitals and nursing note reviewed.  Constitutional:      General: He is not in acute distress.    Appearance: He is well-developed.  HENT:     Head: Normocephalic and atraumatic.  Eyes:     General: No visual field deficit.    Conjunctiva/sclera: Conjunctivae normal.     Pupils: Pupils are equal, round, and reactive to light.  Cardiovascular:     Rate and Rhythm: Normal rate and regular rhythm.     Heart sounds: No murmur heard. Pulmonary:     Effort: Pulmonary effort is normal. No respiratory distress.     Breath sounds: Normal breath sounds. No wheezing or rales.  Abdominal:     General: There is no distension.     Palpations: Abdomen is soft.     Tenderness: There is no abdominal tenderness. There is no guarding or rebound.  Musculoskeletal:        General: No tenderness. Normal range of motion.     Cervical back: Normal range of motion and neck supple.  Skin:    General: Skin is warm and dry.     Findings: No erythema or rash.  Neurological:     Mental Status: He is alert  and oriented to person, place, and time.     Cranial Nerves: No cranial nerve deficit.     Sensory: No sensory deficit.     Motor: No weakness or pronator drift.     Coordination: Coordination normal.  Psychiatric:        Mood and Affect: Mood normal.        Behavior: Behavior normal.    ED Results / Procedures / Treatments   Labs (all labs ordered are listed, but only abnormal results are displayed) Labs Reviewed  PROTIME-INR - Abnormal; Notable for the following components:      Result Value   Prothrombin Time 15.6 (*)    INR 1.3 (*)    All other components within normal limits  CBC - Abnormal; Notable for the following components:   RBC 3.45 (*)    Hemoglobin 10.0 (*)    HCT 31.1 (*)    All other components within normal limits  COMPREHENSIVE METABOLIC PANEL - Abnormal; Notable for the following components:   Chloride 115 (*)     CO2 21 (*)    Glucose, Bld 106 (*)    BUN 31 (*)    Creatinine, Ser 1.34 (*)    Albumin 3.4 (*)    GFR, Estimated 50 (*)    All other components within normal limits  I-STAT CHEM 8, ED - Abnormal; Notable for the following components:   Chloride 112 (*)    BUN 30 (*)    Creatinine, Ser 1.30 (*)    Glucose, Bld 105 (*)    TCO2 21 (*)    Hemoglobin 9.2 (*)    HCT 27.0 (*)    All other components within normal limits  APTT  DIFFERENTIAL  CBG MONITORING, ED  TROPONIN I (HIGH SENSITIVITY)  TROPONIN I (HIGH SENSITIVITY)    EKG EKG Interpretation  Date/Time:  Monday Aug 18 2021 10:27:37 EDT Ventricular Rate:  66 PR Interval:  206 QRS Duration: 79 QT Interval:  408 QTC Calculation: 428 R Axis:   60 Text Interpretation: Sinus rhythm Anteroseptal infarct, age indeterminate No previous tracing Confirmed by Gwyneth Sprout ([id number]) on 08/18/2021 11:34:30 AM  Radiology CT HEAD CODE STROKE WO CONTRAST  Result Date: 08/18/2021 CLINICAL DATA:  Code stroke.  Neuro deficit, acute, stroke suspected EXAM: CT HEAD WITHOUT CONTRAST TECHNIQUE: Contiguous axial images were obtained from the base of the skull through the vertex without intravenous contrast. RADIATION DOSE REDUCTION: This exam was performed according to the departmental dose-optimization program which includes automated exposure control, adjustment of the mA and/or kV according to patient size and/or use of iterative reconstruction technique. COMPARISON:  None Available. FINDINGS: Brain: There is no acute intracranial hemorrhage, mass effect, or edema. Gray-white differentiation is preserved. Prominence of the ventricles and sulci reflects parenchymal volume loss. Patchy and confluent areas of hypoattenuation in the supratentorial white matter are nonspecific but may reflect mild to moderate chronic microvascular ischemic changes. Age-indeterminate small vessel infarct of the left corona radiata and lentiform nucleus. Vascular: No  hyperdense vessel. There is intracranial atherosclerotic calcification at the skull base. Skull: Unremarkable. Sinuses/Orbits: No acute abnormality. Other: Mastoid air cells are clear. ASPECTS ([place] Stroke Program Early CT Score) - Ganglionic level infarction (caudate, lentiform nuclei, internal capsule, insula, M1-M3 cortex): 7 - Supraganglionic infarction (M4-M6 cortex): 3 Total score (0-10 with 10 being normal): 10 IMPRESSION: There is no acute intracranial hemorrhage or evidence of acute infarction. ASPECT score is 10. Mild to moderate chronic microvascular ischemic changes. Age-indeterminate small vessel infarct  of the left corona radiata and lentiform nucleus. These results were communicated to Dr. Thomasena Edisollins at 10:19 am on 08/18/2021 by text page via the [facility] messaging system. Electronically Signed   By: Guadlupe SpanishPraneil  Patel M.D.   On: 08/18/2021 10:19    Procedures Procedures    Medications Ordered in ED Medications  0.9 %  sodium chloride infusion (0 mLs Intravenous Paused 08/18/21 1330)  sodium chloride flush (NS) 0.9 % injection 3 mL (3 mLs Intravenous Given 08/18/21 1031)    ED Course/ Medical Decision Making/ A&P                           Medical Decision Making Amount and/or Complexity of Data Reviewed Independent Historian: EMS    Details: grandson Labs: ordered. Decision-making details documented in ED Course. Radiology: ordered and independent interpretation performed. Decision-making details documented in ED Course. ECG/medicine tests: ordered and independent interpretation performed. Decision-making details documented in ED Course.   Pt with multiple medical problems and comorbidities and presenting today with a complaint that caries a high risk for morbidity and mortality.  Patient presenting today initially as a code stroke due to a left-sided facial droop and some slurred speech.  However during this time patient was also hypotensive.  Currently patient's symptoms are completely  resolved as well as he now has a more normal blood pressure.  He reports feeling very hot prior to the event happening but denied feeling lightheaded or near syncopal.  No history of recurrent syncope.  Patient appears to be at his baseline at this time.  The neurology team was present when patient arrived.  At this time they are recommending MRI and CTA to ensure no other acute findings but if negative they do not feel that this was a stroke.  Could be hypoperfusion from having a low blood pressure possible orthostatic hypotension.  Patient has not received any fluids and blood pressure has normalized. I independently interpreted patient's EKG today and labs.  I had our is 1.3, PTT is normal, CBC with a hemoglobin of 10 without old to compare and a normal white count, CMP with creatinine of 1.34 with normal LFTs, sodium and potassium.  Mild elevation of chloride at 115 and normal anion gap.  Patient's blood sugars within normal limits.  I have independently visualized and interpreted pt's images today.  Head CT shows no evidence of intracranial hemorrhage and radiology reports mild to moderate chronic microvascular ischemic changes and age-indeterminate small vessel infarct of the left corona radiata and lentiform nucleus.  Patient is currently still in the window bed at this time because his NIH is 0 and he is back to baseline he is not a candidate for TNKase.  We will continue to monitor if symptoms return he is still in the window until 1 PM.  3:00 PM Patient's troponin is negative.  He remains well-appearing.  Imaging is pending and grandson is now present.  [place] did recently [place] and had poor oral intake yesterday and was drinking scotch with his grandson last night.  Most likely orthostatic hypotension related to poor oral intake yesterday.  Patient remains stable and feel that if imaging is negative he is clear for discharge back with his grandson.          Final  Clinical Impression(s) / ED Diagnoses Final diagnoses:  Orthostatic hypotension    Rx / DC Orders ED Discharge Orders     None  Gwyneth Sprout, MD 08/18/21 1500

## 2021-08-18 NOTE — ED Provider Notes (Signed)
Patient seen after prior EDP.  Patient has been evaluated by neurology as a code stroke  Patient does not require admission for further neurologic work-up.  Patient feels significantly improved after ED evaluation.  He and his family at bedside desire discharge home.  Patient is visiting here from [place].  He plans on returning home to [place] on Wednesday.  Patient does understand need for close outpatient follow-up upon return to home in [place].   Wynetta Fines, MD 08/18/21 1816

## 2021-08-18 NOTE — ED Notes (Signed)
Patient to MRI at this time.
